# Patient Record
Sex: Male | Born: 1985 | Race: White | Hispanic: No | Marital: Single | Smoking: Never smoker
Health system: Southern US, Community
[De-identification: ages and names within clinical notes are randomized; demographics above are authoritative.]

## PROBLEM LIST (undated history)

## (undated) DIAGNOSIS — F419 Anxiety disorder, unspecified: Secondary | ICD-10-CM

## (undated) DIAGNOSIS — F329 Major depressive disorder, single episode, unspecified: Secondary | ICD-10-CM

## (undated) DIAGNOSIS — G479 Sleep disorder, unspecified: Secondary | ICD-10-CM

## (undated) DIAGNOSIS — F32A Depression, unspecified: Secondary | ICD-10-CM

## (undated) HISTORY — PX: UPPER GASTROINTESTINAL ENDOSCOPY: SHX188

---
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
## 2006-10-16 ENCOUNTER — Emergency Department (HOSPITAL_COMMUNITY): Admission: EM | Admit: 2006-10-16 | Discharge: 2006-10-16 | Payer: Self-pay | Admitting: Emergency Medicine

## 2006-11-07 ENCOUNTER — Emergency Department (HOSPITAL_COMMUNITY): Admission: EM | Admit: 2006-11-07 | Discharge: 2006-11-07 | Payer: Self-pay | Admitting: Emergency Medicine

## 2006-11-15 ENCOUNTER — Emergency Department (HOSPITAL_COMMUNITY): Admission: EM | Admit: 2006-11-15 | Discharge: 2006-11-15 | Payer: Self-pay | Admitting: Emergency Medicine
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
## 2010-02-28 ENCOUNTER — Emergency Department (HOSPITAL_COMMUNITY): Admission: EM | Admit: 2010-02-28 | Discharge: 2010-02-28 | Payer: Self-pay | Admitting: Emergency Medicine

## 2010-03-05 ENCOUNTER — Ambulatory Visit: Payer: Self-pay | Admitting: Gastroenterology

## 2010-03-05 DIAGNOSIS — R1013 Epigastric pain: Secondary | ICD-10-CM

## 2010-03-06 ENCOUNTER — Encounter: Payer: Self-pay | Admitting: Gastroenterology

## 2010-03-15 ENCOUNTER — Ambulatory Visit (HOSPITAL_COMMUNITY): Admission: RE | Admit: 2010-03-15 | Discharge: 2010-03-15 | Payer: Self-pay | Admitting: Gastroenterology

## 2010-03-27 ENCOUNTER — Telehealth (INDEPENDENT_AMBULATORY_CARE_PROVIDER_SITE_OTHER): Payer: Self-pay

## 2010-04-06 ENCOUNTER — Ambulatory Visit: Payer: Self-pay | Admitting: Gastroenterology

## 2010-07-10 NOTE — Progress Notes (Signed)
Summary: prilosec samples  Phone Note Call from Patient Call back at Home Phone [phone]   Caller: Patient Summary of Call: pt called requesting samples of prilosec  #20 given Initial call taken by: Hendricks Limes LPN,  March 27, 2010 4:13 PM

## 2010-07-10 NOTE — Letter (Signed)
Summary: EGD ORDER  EGD ORDER   Imported By: Ave Filter 03/06/2010 12:51:00  _____________________________________________________________________  External Attachment:    Type:   Image     Comment:   External Document

## 2010-07-10 NOTE — Assessment & Plan Note (Signed)
Summary: stomach burning w/pain/spitting up blood/ss   Visit Type:  Initial Consult Referring Horacio Werth:  [facility] Ivery Quale, [place]) Primary Care Elbridge Magowan:  none  Chief Complaint:  buring and pain in stomach, vomiting, and spitting up blood.  History of Present Illness: Kevin Lam is a pleasant 25 year old male who presents today at the request of Mr. Ivery Quale, [place], from the ED at [facility]. He presented to the ED on 02/28/10 with complaints of severe epigastric pain. Labs were drawn which were within normal limits. No films were done. He was discharged with pain medication and carafate, as well as instructions to continue Prilosec. He states he has had epigastric pain for the past 4-5 years; however, over the past week it has worsened in severity and intensity. He describes the pain as a "wound" as well as "burning". Reports +nausea with carafate, decreased appetite due to pain/nausea; has lost 4 lbs over the past week. Pain is intermittent, aggravated by movement, and not related to eating/drinking. Not relieved by anything. No significant change in bowel habits. Reports alcohol worsens pain as well as spicy foods. +reflux symptoms at night. Admits that he is not taking Prilosec regularly. Since ED visit, has taken a total of 3 times.   Current Problems (verified): 1)  Abdominal Pain-epigastric  (ICD-789.06)  Current Medications (verified): 1)  Carafate 1 Gm Tabs (Sucralfate) .... Two Times A Day 2)  Prilosec 20 Mg Cpdr (Omeprazole) .... Once Daily 3)  Lortab 5-500 Mg Tabs (Hydrocodone-Acetaminophen) .... As Needed  Allergies (verified): No Known Drug Allergies  Past History:  Family History: Last updated: 03/05/2010 Dad: diabetes Mom: gastritis, depression siblings: sister: anxiety, depression, heart condition No FH of Colon Cancer:  Social History: Last updated: 03/05/2010 unemployed Patient currently smokes 10 cigarettes/week Alcohol Use - yes: 5-6 beers  on weekends   Past Medical History: GERD  Past Surgical History: none  Family History: Reviewed history and no changes required. Dad: diabetes Mom: gastritis, depression siblings: sister: anxiety, depression, heart condition No FH of Colon Cancer:  Social History: Reviewed history and no changes required. unemployed Patient currently smokes 10 cigarettes/week Alcohol Use - yes: 5-6 beers on weekends  Smoking Status:  current  Review of Systems General:  Denies fever, chills, and sweats. Eyes:  Denies blurring, irritation, and vision loss. ENT:  Denies sore throat, hoarseness, and difficulty swallowing. CV:  Denies chest pains, dyspnea on exertion, and peripheral edema. Resp:  Denies dyspnea at rest and cough. GI:  Complains of indigestion/heartburn and abdominal pain; denies difficulty swallowing, pain on swallowing, constipation, change in bowel habits, and bloody BM's. GU:  Denies urinary burning and urinary frequency. MS:  Denies joint pain / LOM, joint swelling, and joint stiffness. Neuro:  Denies weakness, syncope, and headache. Psych:  Denies depression, anxiety, and memory loss.  Vital Signs:  Patient profile:   25 year old male Height:      65 inches Weight:      153 pounds BMI:     25.55 Temp:     98.4 degrees F oral Pulse rate:   80 / minute BP sitting:   124 / 88  (left arm) Cuff size:   regular  Vitals Entered By: Hendricks Limes LPN (March 05, 2010 2:11 PM)  Physical Exam  General:  Well developed, well nourished, no acute distress. Eyes:  sclera non-icteric. Mouth:  No deformity or lesions, dentition normal. Lungs:  Clear throughout to auscultation. Heart:  Regular rate and rhythm; no murmurs, rubs,  or bruits. Abdomen:  normal bowel sounds, without guarding, without rebound, and no hepatomegally or splenomegaly.  tenderness in epigastric region with palpation.  Msk:  Symmetrical with no gross deformities. Normal posture. Pulses:  Normal pulses  noted. Extremities:  No clubbing, cyanosis, edema or deformities noted. Neurologic:  Alert and  oriented x4;  grossly normal neurologically.  Impression & Recommendations:  Problem # 1:  ABDOMINAL PAIN-EPIGASTRIC (ICD-69.69) 25 year old, pleasant, Caucasian male with long-standing history of epigastric discomfort, now worsened over the past week requiring ED visit on 9/21. Not related to eating/drinking, worsened with movement, exacerbated by alcohol, spicy foods. + intermittent nausea. Typical GERD symptoms as well.  No significant trial of PPI.  Likely diagnosis GERD vs. gastritis.  Avoid alcohol/nicotine use Prilosec 20 mg samples given to Mr. Lipton for 10 day trial If not improved or worsened over next 8-10 days, will set up for EGD  Orders: Consultation Level IV (78295) Consultation Level III (62130)  Patient Instructions: 1)  Avoid alcohol and nicotine use for now 2)  Prilosec daily X10 days 3)  Call office if symptoms worsen or not alleviated over next week 4)  Discussed possible EGD in near future if symptoms progress, risks and benefits discussed, pt understands and has given verbal consent if it is deemed necessary after trial of PPI  Appended Document: stomach burning w/pain/spitting up blood/ss EGD W/ PROPOFOL ON OCT 6.  Appended Document: stomach burning w/pain/spitting up blood/ss Pt scheduled for 03/15/10.

## 2010-08-22 LAB — BASIC METABOLIC PANEL
BUN: 5 mg/dL — ABNORMAL LOW (ref 6–23)
Creatinine, Ser: 0.86 mg/dL (ref 0.4–1.5)
GFR calc non Af Amer: >60 mL/min (ref >60)
Glucose, Bld: 121 mg/dL — ABNORMAL HIGH (ref 70–99)
Potassium: 3.9 mEq/L (ref 3.5–5.1)

## 2010-08-23 LAB — CBC
HCT: 47.8 % (ref 39.0–52.0)
MCHC: 34 g/dL (ref 30.0–36.0)
Platelets: 209 10*3/uL (ref 150–400)
RDW: 12.8 % (ref 11.5–15.5)
WBC: 9.9 10*3/uL (ref 4.0–10.5)

## 2010-08-23 LAB — DIFFERENTIAL
Basophils Absolute: 0 10*3/uL (ref 0.0–0.1)
Basophils Relative: 1 % (ref 0–1)
Eosinophils Relative: 2 % (ref 0–5)
Lymphocytes Relative: 22 % (ref 12–46)
Monocytes Absolute: 0.9 10*3/uL (ref 0.1–1.0)
Neutro Abs: 6.6 10*3/uL (ref 1.7–7.7)

## 2010-08-23 LAB — HEPATIC FUNCTION PANEL
Bilirubin, Direct: 0.1 mg/dL (ref 0.0–0.3)
Indirect Bilirubin: 0.7 mg/dL (ref 0.3–0.9)
Total Protein: 7.8 g/dL (ref 6.0–8.3)

## 2010-10-26 NOTE — Discharge Summary (Signed)
NAMEOLEGARIO, EMBERSON NO.:  [id number]   MEDICAL RECORD NO.:  [id number]                   PATIENT TYPE:  IPS   LOCATION:  0202                                 FACILITY:  [facility]   PHYSICIAN:  Cindie Crumbly, M.D.               DATE OF BIRTH:  Oct 13, 1985   DATE OF ADMISSION:  03/02/2002  DATE OF DISCHARGE:  03/08/2002                                 DISCHARGE SUMMARY   REASON FOR ADMISSION:  This 25 year old white male was admitted complaining  of depression status post overdose of Valium, hydrocodone and Ambien as a  suicide attempt.  For further history of present illness, please see the  patient's psychiatric admission assessment.   PHYSICAL EXAMINATION:  At the time of admission was entirely unremarkable.   LABORATORY DATA:  The patient underwent a laboratory workup to rule out any  medical problems contributing to his symptomatology.  Urine probe for  gonorrhea and chlamydia were negative.  RPR was nonreactive.  UA was  unremarkable.  CBC showed eosinophil count of 7% and was otherwise  unremarkable.  Basic metabolic panel showed CO2 of 34 and was otherwise  unremarkable.  Urine drug screen was positive for metabolites of  benzodiazepines, opiates and tetrahydrocannabinol.  Blood alcohol level was  negative.  Acetaminophen and salicylate levels were negative.  The patient  received no x-rays, no special procedures, no additional consultations.  He  sustained no complications during the course of this hospitalization.   HOSPITAL COURSE:  On admission, the patient was psychomotor retarded.  Concentration and attention span were decreased.  He was easily distracted  by extraneous stimuli.  He displayed poor impulse control.  His affect and  mood were depressed, irritable and anxious.  He has remained in denial of  his chemical dependency issues.  He was continued on a trial of Celexa and  titrated up to a therapeutic dose.  To this was added  Wellbutrin SR.  He  tolerated these medications well without side effects and, at the time of  discharge, he denies any suicidal or homicidal ideation.  His affect and  mood have improved.  He remains in denial of his chemical dependency  problems and no longer appears to be a danger to himself or others and  consequently is felt to have reached his maximum benefits of hospitalization  and is ready for discharge to a less restrictive alternative setting.   CONDITION ON DISCHARGE:  Improved.   DIAGNOSES (ACCORDING TO DSM-IV):   AXIS I:  1. Major depression, recurrent, severe without psychosis.  2. Rule out obsessive-compulsive disorder.  3. Polysubstance dependence.   AXIS II:  1. Rule out learning disorder not otherwise specified.  2. Rule out personality disorder not otherwise specified.   AXIS III:  Irritable bowel syndrome.   AXIS IV:  Severe.   AXIS V:  20 on admission; 30 on discharge.   FURTHER EVALUATION  AND TREATMENT RECOMMENDATIONS:  1. The patient is discharged to home.  2. He is discharged on an unrestricted level of activity and a regular diet.  3. He is discharged on Wellbutrin XL 300 mg p.o. q.d., Celexa 60 mg p.o.     q.d.  4. He will follow up with his outpatient psychiatrist and psychotherapist     for all further aspects of his psychiatric care and his primary care     physician for all further aspects of his medical care.  Consequently, I     will sign off on the case at this time.                                               Cindie Crumbly, M.D.    TS/MEDQ  D:  03/08/2002  T:  03/08/2002  Job:  [id number]

## 2010-10-26 NOTE — H&P (Signed)
NAMEJANARI, YAMADA NO.:  [id number]   MEDICAL RECORD NO.:  [id number]                   PATIENT TYPE:  IPS   LOCATION:  0202                                 [facility]ACILITY:  [facility]   PHYSICIAN:  Cindie Crumbly, M.D.               DATE OF BIRTH:  08/04/1985   DATE OF ADMISSION:  03/02/2002  DATE OF DISCHARGE:                         PSYCHIATRIC ADMISSION ASSESSMENT   REASON FOR ADMISSION:  This 25 year old white male was admitted complaining  of depression status post overdose on Valium, hydrocodone and Ambien as a  suicide attempt.   HISTORY OF PRESENT ILLNESS:  The patient complains of a depressed, irritable  and angry mood most of the day nearly every day, giving up on activities  previously found pleasurable, feelings of hopelessness, helplessness,  worthlessness, decreased concentration and energy level, increased symptoms  of fatigue, insomnia, decreased appetite, recurrent thoughts of death.  He  states that he will get more pills and overdose again and is refusing to  contract for safety.  He reports several current stressors including his  brother and sister being in the [employer] and being sent overseas.  His  sister was sent to [place] last week.  The patient has been failing in school  grades.  He has been transferred into a new school and he reports that his  grades have continued to decline.   PAST PSYCHIATRIC HISTORY:  Significant for the patient having been on Celexa  at 40 mg p.o. q.d. for most of the past year.  He reports that he initially  did well on this medication but the effect of the medication appears to be  wearing off.  Initially, he had a decrease in obsessive-compulsive  symptomatology, but now he finds himself going back to previous behaviors  including counting multiple times up to the number six.  He reports that he  has no idea why he counts to six.  He knows that it makes no sense to do so  but he is unable to break the  habit of doing so, and this has been  interfering with both school and social interactions with peers and his  parents and teachers.  He reports 3 previous suicide attempts by drug  overdose.  He has been followed in outpatient psychotherapy and medication  management for the past 9 months, but again reports that his medication  didn't work for the last several months.  He reports that he has been  depressed for the past year and a half with recurrent episodes.   DRUG AND ALCOHOL ABUSE HISTORY:  Significant for the patient using cannabis  daily 1 year ago.  He states that his last use was 4 days ago and that he  now uses it intermittently and would like to quit using it as he feels that  it has not been helpful to him.  He smokes approximately 1/2 pack of  cigarettes per day  and has done so for the past 2 years.  He reports that he  has tried alcohol in the past but denies any continued use of alcohol.  With  regard to street drugs, he states that the drugs that he overdosed on he  obtained from a friend.  The friend was apparently called to the office at  school and handed the patient these drugs for fear that the friend might  have to undergo a search while in the principal's office.  The patient then  took the medications home with him that night and took all of them that were  available to him as an attempt to kill himself.   PAST MEDICAL HISTORY:  Significant for a history of irritable bowel  syndrome.  He has no known drug allergies or sensitivities.  He denies any  other medical or surgical problems.   CURRENT MEDICATIONS:  Celexa 40 mg p.o. q.d.   STRENGTHS AND ASSETS:  His mother and father are supportive of him.   FAMILY AND SOCIAL HISTORY:  The patient lives with his mother and father.  Mother has a history of major depression.  The patient is currently  repeating the 9th grade, having failed this past year.   MENTAL STATUS EXAM:  The patient presents as a well-developed,  well-  nourished adolescent white male who is alert, oriented x4, psychomotor  retarded, and whose appearance is compatible with his stated age.  His  speech is coherent with a decreased rate and volume of speech, increased  speech latency.  He displays no looseness of associations, phonemic errors  or evidence of a thought disorder.  His concentration and attention span are  decreased.  He is easily distracted by extraneous stimuli.  He displays poor  impulse control.  His affect and mood are depressed, irritable and anxious.  His immediate recall, short term memory and remote memory are intact.  Similarities and differences are within normal limits.  He is able to  abstract the proverbs.  His thought process are generally goal directed.   ADMISSION DIAGNOSES:   AXIS I:  1. Major depression, recurrent, severe, without psychosis.  2. Obsessive-compulsive disorder.  3. Polysubstance abuse.  4. Rule out polysubstance dependence.   AXIS II:  Rule out learning disorder not otherwise specified.   AXIS III:  Irritable bowel syndrome.   AXIS IV:  Severe.   AXIS V:  Code 20.   FURTHER EVALUATION AND TREATMENT RECOMMENDATIONS:  1. Estimated length of stay for the patient on the inpatient unit is 5 to 7     days.  2. Initial discharge plan is to discharge the patient to home.  3. Initial plan of care is to increase Celexa to 60 mg p.o. q.a.m.  To this     regimen will be added Wellbutrin SR which will be titrated up to a     therapeutic dose.  The patient reports that even while taking Celexa and     having been able to be in remission for most of his depressive symptoms,     he reports that Celexa was never able to return him back to his baseline     where he had increased concentration and able to do his school work.     Thus he appeared to have an inadequate response to Celexa even at its     best and relapsed.  It appears that the patient at this point in time    needs a  norepinephrine  reuptake inhibitor in addition to a serotonin     reuptake inhibitor, thus the need for this combination of medications.     The patient will also be given a NicoDerm patch for nicotine withdrawal.     He will undergo a laboratory workup to rule out any other medical     problems contributing to his symptomatology.  Psychotherapy will focus on     addressing the patient's chemical dependency problems, decreasing     cognitive distortions and potential for self harm.                                                 Cindie Crumbly, M.D.    TS/MEDQ  D:  03/03/2002  T:  03/04/2002  Job:  [id number]

## 2010-12-21 ENCOUNTER — Emergency Department (HOSPITAL_COMMUNITY)
Admission: EM | Admit: 2010-12-21 | Discharge: 2010-12-22 | Disposition: A | Discharge status: Home / Self Care | Payer: 59 | Attending: Emergency Medicine | Admitting: Emergency Medicine

## 2010-12-21 ENCOUNTER — Emergency Department (HOSPITAL_COMMUNITY)
Admission: EM | Admit: 2010-12-21 | Discharge: 2010-12-21 | Disposition: A | Discharge status: Home / Self Care | Payer: 59 | Source: Home / Self Care | Attending: Emergency Medicine | Admitting: Emergency Medicine

## 2010-12-21 ENCOUNTER — Encounter: Payer: Self-pay | Admitting: *Deleted

## 2010-12-21 DIAGNOSIS — J36 Peritonsillar abscess: Secondary | ICD-10-CM | POA: Insufficient documentation

## 2010-12-21 DIAGNOSIS — R509 Fever, unspecified: Secondary | ICD-10-CM | POA: Insufficient documentation

## 2010-12-21 DIAGNOSIS — J039 Acute tonsillitis, unspecified: Secondary | ICD-10-CM | POA: Insufficient documentation

## 2010-12-21 HISTORY — DX: Major depressive disorder, single episode, unspecified: F32.9

## 2010-12-21 HISTORY — DX: Anxiety disorder, unspecified: F41.9

## 2010-12-21 HISTORY — DX: Sleep disorder, unspecified: G47.9

## 2010-12-21 HISTORY — DX: Depression, unspecified: F32.A

## 2010-12-21 LAB — RAPID STREP SCREEN (MED CTR MEBANE ONLY): Streptococcus, Group A Screen (Direct): POSITIVE — AB

## 2010-12-21 MED ORDER — HYDROMORPHONE HCL 1 MG/ML IJ SOLN
1.0000 mg | Freq: Once | INTRAMUSCULAR | Status: AC
  Administered 2010-12-21: 1 mg via INTRAVENOUS
  Filled 2010-12-21: qty 1

## 2010-12-21 MED ORDER — SODIUM CHLORIDE 0.9 % IV SOLN
Freq: Once | INTRAVENOUS | Status: AC
  Administered 2010-12-21: 21:00:00 via INTRAVENOUS

## 2010-12-21 MED ORDER — PIPERACILLIN-TAZOBACTAM 3.375 G IVPB
3.3750 g | Freq: Once | INTRAVENOUS | Status: DC
  Administered 2010-12-21: 3.375 g via INTRAVENOUS
  Filled 2010-12-21: qty 50

## 2010-12-21 MED ORDER — ONDANSETRON HCL 4 MG/2ML IJ SOLN
4.0000 mg | Freq: Once | INTRAMUSCULAR | Status: AC
  Administered 2010-12-21: 4 mg via INTRAVENOUS
  Filled 2010-12-21: qty 2

## 2010-12-21 NOTE — ED Notes (Signed)
Pt left the er to go by pov to [place] for eent follow up, pt has a driver

## 2010-12-21 NOTE — ED Provider Notes (Addendum)
History     Chief Complaint  Patient presents with  . Dysphagia   Patient is a 25 y.o. male presenting with pharyngitis. The history is provided by the patient. No language interpreter was used.  Sore Throat This is a new problem. Episode onset: 2 days ago. The problem has been gradually worsening. Associated symptoms include neck pain and a sore throat. Pertinent negatives include no fever, nausea or swollen glands.  Sore Throat This is a new problem. Episode onset: 2 days ago. The problem has been gradually worsening.    Past Medical History  Diagnosis Date  . Depression   . Anxiety   . Sleep disorder     Past Surgical History  Procedure Date  . Upper gastrointestinal endoscopy     History reviewed. No pertinent family history.  History  Substance Use Topics  . Smoking status: Not on file  . Smokeless tobacco: Not on file  . Alcohol Use: Yes     occasionally      Review of Systems  Constitutional: Negative for fever.  HENT: Positive for sore throat, trouble swallowing, neck pain and voice change.   Gastrointestinal: Negative for nausea.  pt seen yest by his PCP at [facility] initial dose of zithromax yest and 2nd dose today.  Given an injection yest but doesn't know what the medication was.  He is here in the ED today b/c of worsening pain and extreme difficulty swallowing.  Physical Exam  BP 144/93  Pulse 113  Temp(Src) 100.8 F (38.2 C) (Oral)  Resp 20  Ht 5\' 6"  (1.676 m)  Wt 150 lb (68.04 kg)  BMI 24.21 kg/m2  SpO2 99%  Physical Exam  Nursing note and vitals reviewed. Constitutional: He is oriented to person, place, and time. Vital signs are normal. He appears well-developed and well-nourished.  HENT:  Head: Normocephalic and atraumatic.  Right Ear: External ear normal.  Left Ear: External ear normal.  Nose: Nose normal.  Mouth/Throat: No oropharyngeal exudate.         L soft palate swelling with uvular deviation to the R.  "hot  potato voice".  Will not swallow saliva.  Eyes: Conjunctivae and EOM are normal. Pupils are equal, round, and reactive to light. Right eye exhibits no discharge. Left eye exhibits no discharge. No scleral icterus.  Neck: Normal range of motion. Neck supple. No JVD present. No tracheal deviation present. No thyromegaly present.  Cardiovascular: Normal rate, regular rhythm, normal heart sounds, intact distal pulses and normal pulses.  Exam reveals no gallop and no friction rub.   No murmur heard. Pulmonary/Chest: Effort normal and breath sounds normal. No stridor. No respiratory distress. He has no wheezes. He has no rales. He exhibits no tenderness.  Abdominal: Soft. Normal appearance and bowel sounds are normal. He exhibits no distension and no mass. There is no tenderness. There is no rebound and no guarding.  Musculoskeletal: Normal range of motion. He exhibits no edema and no tenderness.  Lymphadenopathy:    He has no cervical adenopathy.  Neurological: He is alert and oriented to person, place, and time. He has normal reflexes. No cranial nerve deficit. Coordination normal. GCS eye subscore is 4. GCS verbal subscore is 5. GCS motor subscore is 6.  Reflex Scores:      Tricep reflexes are 2+ on the right side and 2+ on the left side.      Bicep reflexes are 2+ on the right side and 2+ on the left side.  Brachioradialis reflexes are 2+ on the right side and 2+ on the left side.      Patellar reflexes are 2+ on the right side and 2+ on the left side.      Achilles reflexes are 2+ on the right side and 2+ on the left side. Skin: Skin is warm and dry. No rash noted. He is not diaphoretic.  Psychiatric: He has a normal mood and affect. His speech is normal and behavior is normal. Judgment and thought content normal. Cognition and memory are normal.    ED Course  Procedures  MDM i spoke with dr. Annalee Genta.  He requests that the pt [facility] and he will treat him  there.      Worthy Rancher, PA  Medical screening examination/treatment/procedure(s) were performed by non-physician practitioner and as supervising physician I was immediately available for consultation/collaboration.  12/21/10 2111  Benny Lennert, MD 01/25/11 1357

## 2010-12-21 NOTE — ED Notes (Signed)
Pt c/o difficulty swallowing x 2 days; pt states he went to his PCP yesterday and was given a steroid shot but has had no relief and he feels like the swelling is worse

## 2010-12-21 NOTE — ED Notes (Signed)
Call [facility] for ENT on call will call back to [phone].

## 2011-03-28 LAB — DIFFERENTIAL
Basophils Relative: 0
Lymphocytes Relative: 13
Lymphs Abs: 1.5
Monocytes Relative: 5
Neutro Abs: 9.4 — ABNORMAL HIGH
Neutrophils Relative %: 81 — ABNORMAL HIGH

## 2011-03-28 LAB — BASIC METABOLIC PANEL
Calcium: 9
Creatinine, Ser: 0.96
GFR calc Af Amer: >60
GFR calc non Af Amer: >60
Sodium: 134 — ABNORMAL LOW

## 2011-03-28 LAB — CBC
Hemoglobin: 15.3
RBC: 4.94
WBC: 11.7 — ABNORMAL HIGH

## 2013-09-04 ENCOUNTER — Emergency Department (HOSPITAL_COMMUNITY): Payer: BC Managed Care – PPO

## 2013-09-04 ENCOUNTER — Emergency Department (HOSPITAL_COMMUNITY)
Admission: EM | Admit: 2013-09-04 | Discharge: 2013-09-04 | Disposition: A | Discharge status: Home / Self Care | Payer: BC Managed Care – PPO | Attending: Emergency Medicine | Admitting: Emergency Medicine

## 2013-09-04 ENCOUNTER — Encounter (HOSPITAL_COMMUNITY): Payer: Self-pay | Admitting: Emergency Medicine

## 2013-09-04 DIAGNOSIS — S46909A Unspecified injury of unspecified muscle, fascia and tendon at shoulder and upper arm level, unspecified arm, initial encounter: Secondary | ICD-10-CM | POA: Insufficient documentation

## 2013-09-04 DIAGNOSIS — W08XXXA Fall from other furniture, initial encounter: Secondary | ICD-10-CM | POA: Insufficient documentation

## 2013-09-04 DIAGNOSIS — Z79899 Other long term (current) drug therapy: Secondary | ICD-10-CM | POA: Insufficient documentation

## 2013-09-04 DIAGNOSIS — Z8659 Personal history of other mental and behavioral disorders: Secondary | ICD-10-CM | POA: Insufficient documentation

## 2013-09-04 DIAGNOSIS — Y929 Unspecified place or not applicable: Secondary | ICD-10-CM | POA: Insufficient documentation

## 2013-09-04 DIAGNOSIS — Y939 Activity, unspecified: Secondary | ICD-10-CM | POA: Insufficient documentation

## 2013-09-04 DIAGNOSIS — S4980XA Other specified injuries of shoulder and upper arm, unspecified arm, initial encounter: Secondary | ICD-10-CM | POA: Insufficient documentation

## 2013-09-04 DIAGNOSIS — M25512 Pain in left shoulder: Secondary | ICD-10-CM

## 2013-09-04 MED ORDER — OXYCODONE-ACETAMINOPHEN 5-325 MG PO TABS: 1.0000 | ORAL_TABLET | ORAL | Status: DC | PRN

## 2013-09-04 MED ORDER — IBUPROFEN 800 MG PO TABS: 800.0000 mg | ORAL_TABLET | Freq: Three times a day (TID) | ORAL | Status: DC

## 2013-09-04 MED ORDER — OXYCODONE-ACETAMINOPHEN 5-325 MG PO TABS
2.0000 | ORAL_TABLET | Freq: Once | ORAL | Status: AC
  Administered 2013-09-04: 2 via ORAL
  Filled 2013-09-04: qty 2

## 2013-09-04 NOTE — ED Notes (Signed)
T. Triplett, PA at bedside. 

## 2013-09-04 NOTE — ED Notes (Signed)
Pt unable to use left arm from fall last night, assisted pt with gown placement, abrasion noted to left temple as well, pt denies LOC/N/V or blurry vision
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
## 2013-09-04 NOTE — Discharge Instructions (Signed)
   Shoulder Pain The shoulder is the joint that connects your arms to your body. The bones that form the shoulder joint include the upper arm bone (humerus), the shoulder blade (scapula), and the collarbone (clavicle). The top of the humerus is shaped like a ball and fits into a rather flat socket on the scapula (glenoid cavity). A combination of muscles and strong, fibrous tissues that connect muscles to bones (tendons) support your shoulder joint and hold the ball in the socket. Small, fluid-filled sacs (bursae) are located in different areas of the joint. They act as cushions between the bones and the overlying soft tissues and help reduce friction between the gliding tendons and the bone as you move your arm. Your shoulder joint allows a wide range of motion in your arm. This range of motion allows you to do things like scratch your back or throw a ball. However, this range of motion also makes your shoulder more prone to pain from overuse and injury. Causes of shoulder pain can originate from both injury and overuse and usually can be grouped in the following four categories:  Redness, swelling, and pain (inflammation) of the tendon (tendinitis) or the bursae (bursitis).  Instability, such as a dislocation of the joint.  Inflammation of the joint (arthritis).  Broken bone (fracture). HOME CARE INSTRUCTIONS   Apply ice to the sore area.  Put ice in a plastic bag.  Place a towel between your skin and the bag.  Leave the ice on for 15-20 minutes, 03-04 times per day for the first 2 days.  Stop using cold packs if they do not help with the pain.  If you have a shoulder sling or immobilizer, wear it as long as your caregiver instructs. Only remove it to shower or bathe. Move your arm as little as possible, but keep your hand moving to prevent swelling.  Squeeze a soft ball or foam pad as much as possible to help prevent swelling.  Only take over-the-counter or prescription medicines for  pain, discomfort, or fever as directed by your caregiver. SEEK MEDICAL CARE IF:   Your shoulder pain increases, or new pain develops in your arm, hand, or fingers.  Your hand or fingers become cold and numb.  Your pain is not relieved with medicines. SEEK IMMEDIATE MEDICAL CARE IF:   Your arm, hand, or fingers are numb or tingling.  Your arm, hand, or fingers are significantly swollen or turn white or blue. MAKE SURE YOU:   Understand these instructions.  Will watch your condition.  Will get help right away if you are not doing well or get worse. Document Released: 03/06/2005 Document Revised: 02/19/2012 Document Reviewed: 05/11/2011 [employer] Patient Information 2014 [employer].  

## 2013-09-04 NOTE — ED Notes (Signed)
Pt verbalized understanding of no driving within 4 hours of taking percocet due to med may causes drowsiness, pt also made aware that med causes constipation

## 2013-09-06 NOTE — ED Provider Notes (Signed)
CSN: [id number]     Arrival date & time 09/04/13  1243 History   First MD Initiated Contact with Patient 09/04/13 1258     Chief Complaint  Patient presents with  . Shoulder Pain     (Consider location/radiation/quality/duration/timing/severity/associated sxs/prior Treatment) Patient is a 28 y.o. male presenting with shoulder pain. The history is provided by the patient.  Shoulder Pain This is a new problem. The current episode started yesterday. The problem occurs constantly. The problem has been unchanged. Associated symptoms include arthralgias. Pertinent negatives include no abdominal pain, chest pain, chills, fever, headaches, joint swelling, nausea, neck pain, numbness, rash, sore throat, vertigo, visual change, vomiting or weakness. Exacerbated by: left arm movement. He has tried NSAIDs for the symptoms. The treatment provided no relief.   Patient reports left shoulder pain and difficulty moving the left arm since a fall in which he landed directly onto the left shoulder.  He states he fell approximately 4 feet off a wooden deck on the evening prior to ED arrival.  He reports pain with attempted movement of the left shoulder.  Pain resolves when the left arm is held to his body.  He denies neck pain, headaches, dizziness, chest pain, or LOC.    Past Medical History  Diagnosis Date  . Depression   . Anxiety   . Sleep disorder    Past Surgical History  Procedure Laterality Date  . Upper gastrointestinal endoscopy     No family history on file. History  Substance Use Topics  . Smoking status: Never Smoker   . Smokeless tobacco: Not on file  . Alcohol Use: Yes     Comment: occasionally    Review of Systems  Constitutional: Negative for fever and chills.  HENT: Negative for sore throat.   Cardiovascular: Negative for chest pain.  Gastrointestinal: Negative for nausea, vomiting and abdominal pain.  Genitourinary: Negative for dysuria and difficulty urinating.   Musculoskeletal: Positive for arthralgias. Negative for back pain, gait problem, joint swelling and neck pain.  Skin: Negative for color change, rash and wound.  Neurological: Negative for vertigo, weakness, numbness and headaches.  All other systems reviewed and are negative.      Allergies  Review of patient's allergies indicates no known allergies.  Home Medications   Current Outpatient Rx  Name  Route  Sig  Dispense  Refill  . ibuprofen (ADVIL) 200 MG tablet   Oral   Take 600 mg by mouth every 6 (six) hours as needed for moderate pain.         Marland Kitchen ibuprofen (ADVIL,MOTRIN) 800 MG tablet   Oral   Take 1 tablet (800 mg total) by mouth 3 (three) times daily.   21 tablet   0   . oxyCODONE-acetaminophen (PERCOCET/ROXICET) 5-325 MG per tablet   Oral   Take 1 tablet by mouth every 4 (four) hours as needed for severe pain.   20 tablet   0    BP 126/83  Pulse 102  Temp(Src) 98 F (36.7 C) (Oral)  Resp 12  Ht 5\' 6"  (1.676 m)  Wt 170 lb (77.111 kg)  BMI 27.45 kg/m2  SpO2 95% Physical Exam  Nursing note and vitals reviewed. Constitutional: He is oriented to person, place, and time. He appears well-developed and well-nourished. No distress.  HENT:  Head: Normocephalic and atraumatic.  Neck: Normal range of motion. Neck supple. No thyromegaly present.  Cardiovascular: Normal rate, regular rhythm, normal heart sounds and intact distal pulses.   No murmur heard.  Pulmonary/Chest: Effort normal and breath sounds normal. No respiratory distress. He exhibits no tenderness.  Musculoskeletal: He exhibits tenderness. He exhibits no edema.  ttp of the left anterior shoulder.  Pain with abduction of the left arm and rotation of the shoulder.  Radial pulse is brisk, distal sensation intact, CR< 2 sec. Grip strength is strong and symmetrical.   No abrasions, edema , erythema or step-off deformity of the joint. Left elbow and wrist are NT  Lymphadenopathy:    He has no cervical  adenopathy.  Neurological: He is alert and oriented to person, place, and time. He has normal strength. No sensory deficit. He exhibits normal muscle tone. Coordination normal.  Skin: Skin is warm and dry.    ED Course  Procedures (including critical care time) Labs Review Labs Reviewed - No data to display Imaging Review Dg Shoulder Left  09/04/2013   CLINICAL DATA:  Shoulder pain.  Fall.  EXAM: LEFT SHOULDER - 2+ VIEW  COMPARISON:  None.  FINDINGS: There is no evidence of fracture or dislocation. There is no evidence of arthropathy or other focal bone abnormality. Soft tissues are unremarkable.  IMPRESSION: Negative.   Electronically Signed   By: Charlett NoseKevin  Dover M.D.   On: 09/04/2013 13:20     EKG Interpretation None      MDM   Final diagnoses:  Shoulder pain, left    Sling applied.  XR results reviewed and discussed with patient.  Exam is concerning for possible rotator cuff injury vs labral injury.  Patient agrees to symptomatic treatment , ice and close f/u with orthopedics.  Given referral info.    Pt is well appearing, VSS.  Appears stable for d/c    Avalee Castrellon L. Trisha Mangleriplett, PA-C 09/06/13 1736

## 2013-09-09 NOTE — ED Provider Notes (Signed)
Medical screening examination/treatment/procedure(s) were performed by non-physician practitioner and as supervising physician I was immediately available for consultation/collaboration.   EKG Interpretation None       Ranetta Armacost, MD 09/09/13 0639 

## 2019-06-23 ENCOUNTER — Ambulatory Visit: Payer: 59 | Attending: Internal Medicine

## 2019-11-09 ENCOUNTER — Telehealth: Payer: Self-pay | Admitting: Orthopaedic Surgery

## 2019-11-09 NOTE — Telephone Encounter (Signed)
Patient called per voice message this afternoon; call returned - unable to leave message as voice messages are full.

## 2023-10-14 ENCOUNTER — Emergency Department (HOSPITAL_COMMUNITY)
Admission: EM | Admit: 2023-10-14 | Discharge: 2023-10-14 | Disposition: A | Discharge status: Home / Self Care | Attending: Emergency Medicine | Admitting: Emergency Medicine

## 2023-10-14 ENCOUNTER — Encounter (HOSPITAL_COMMUNITY): Payer: Self-pay

## 2023-10-14 ENCOUNTER — Emergency Department (HOSPITAL_COMMUNITY)

## 2023-10-14 ENCOUNTER — Other Ambulatory Visit: Payer: Self-pay

## 2023-10-14 DIAGNOSIS — R002 Palpitations: Secondary | ICD-10-CM | POA: Insufficient documentation

## 2023-10-14 DIAGNOSIS — R0602 Shortness of breath: Secondary | ICD-10-CM | POA: Insufficient documentation

## 2023-10-14 LAB — CBC WITH DIFFERENTIAL/PLATELET
Abs Immature Granulocytes: 0.05 10*3/uL (ref 0.00–0.07)
Basophils Absolute: 0.1 10*3/uL (ref 0.0–0.1)
Basophils Relative: 1 %
Eosinophils Absolute: 0.3 10*3/uL (ref 0.0–0.5)
Eosinophils Relative: 4 %
HCT: 45.1 % (ref 39.0–52.0)
Hemoglobin: 15.5 g/dL (ref 13.0–17.0)
Immature Granulocytes: 1 %
Lymphocytes Relative: 18 %
Lymphs Abs: 1.6 10*3/uL (ref 0.7–4.0)
MCH: 30.6 pg (ref 26.0–34.0)
MCHC: 34.4 g/dL (ref 30.0–36.0)
MCV: 89.1 fL (ref 80.0–100.0)
Monocytes Absolute: 0.7 10*3/uL (ref 0.1–1.0)
Monocytes Relative: 8 %
Neutro Abs: 6.1 10*3/uL (ref 1.7–7.7)
Neutrophils Relative %: 68 %
Platelets: 214 10*3/uL (ref 150–400)
RBC: 5.06 MIL/uL (ref 4.22–5.81)
RDW: 13.4 % (ref 11.5–15.5)
WBC: 8.9 10*3/uL (ref 4.0–10.5)
nRBC: 0 % (ref 0.0–0.2)

## 2023-10-14 LAB — TROPONIN I (HIGH SENSITIVITY): Troponin I (High Sensitivity): 3 ng/L (ref <18)

## 2023-10-14 LAB — MAGNESIUM: Magnesium: 2.2 mg/dL (ref 1.7–2.4)

## 2023-10-14 LAB — COMPREHENSIVE METABOLIC PANEL WITH GFR
ALT: 42 U/L (ref 0–44)
AST: 31 U/L (ref 15–41)
Albumin: 4.1 g/dL (ref 3.5–5.0)
Alkaline Phosphatase: 54 U/L (ref 38–126)
Anion gap: 9 (ref 5–15)
BUN: 10 mg/dL (ref 6–20)
CO2: 22 mmol/L (ref 22–32)
Calcium: 9.1 mg/dL (ref 8.9–10.3)
Chloride: 103 mmol/L (ref 98–111)
Creatinine, Ser: 0.79 mg/dL (ref 0.61–1.24)
GFR, Estimated: >60 mL/min (ref >60)
Glucose, Bld: 112 mg/dL — ABNORMAL HIGH (ref 70–99)
Potassium: 3.8 mmol/L (ref 3.5–5.1)
Sodium: 134 mmol/L — ABNORMAL LOW (ref 135–145)
Total Bilirubin: 0.9 mg/dL (ref 0.0–1.2)
Total Protein: 7.4 g/dL (ref 6.5–8.1)

## 2023-10-14 LAB — ETHANOL: Alcohol, Ethyl (B): <15 mg/dL (ref <15)

## 2023-10-14 NOTE — Discharge Instructions (Signed)
 Your test results today are all reassuring.  Your heart rate and rhythm were normal while you were in the emergency department.  I suspect that your episodes are related to alcohol use from the day prior.  If possible, cut back on daily alcohol use to minimize symptoms.  A cardiology referral has been ordered.  You should hear from their office in the next few days.  If you do not, call the telephone number below to set up a follow-up appointment.  Return to the emergency department for any new or worsening symptoms of concern.

## 2023-10-14 NOTE — ED Provider Notes (Signed)
 [facility] AT [facility] Provider Note   CSN: [id number] Arrival date & time: 10/14/23  1519     History  Chief Complaint  Patient presents with   Tachycardia    TARI JOINTER is a 38 y.o. male.  HPI Patient presents for palpitations.  Medical history includes anxiety, depression.  He is a daily drinker.  Daily consumption of alcohol is 3 shots of liquor and 8-10 beers.  He did drink extra yesterday due to sink at a mile holiday.  This morning, he felt to be in his normal state of health.  At around 2 PM, he had an episode of palpitations and shortness of breath.  These episodes have been recurrent since that time.  He states that he has had similar episodes before but today has been more severe and more frequent.  He went to urgent care and was sent to the ED for further evaluation.  Currently, he denies any symptoms.  He states that he has been getting intermittent episodes of palpitations while in the ED waiting room.     Home Medications Prior to Admission medications   Medication Sig Start Date End Date Taking? Authorizing Provider  ibuprofen  (ADVIL ) 200 MG tablet Take 600 mg by mouth every 6 (six) hours as needed for moderate pain.    [provider]  ibuprofen  (ADVIL ,MOTRIN ) 800 MG tablet Take 1 tablet (800 mg total) by mouth 3 (three) times daily. 09/04/13   Triplett, Tammy, PA-C  oxyCODONE -acetaminophen  (PERCOCET/ROXICET) 5-325 MG per tablet Take 1 tablet by mouth every 4 (four) hours as needed for severe pain. 09/04/13   Triplett, Benn Brash, PA-C      Allergies    Patient has no known allergies.    Review of Systems   Review of Systems  Respiratory:  Positive for shortness of breath.   Cardiovascular:  Positive for palpitations.  Psychiatric/Behavioral:  The patient is nervous/anxious.   All other systems reviewed and are negative.   Physical Exam Updated Vital Signs BP (!) 155/112   Pulse (!) 101   Temp 98.1 F (36.7 C)  (Oral)   Resp 18   Ht 5\' 6"  (1.676 m)   Wt 77.1 kg   SpO2 97%   BMI 27.43 kg/m  Physical Exam Vitals and nursing note reviewed.  Constitutional:      General: He is not in acute distress.    Appearance: Normal appearance. He is well-developed. He is not ill-appearing, toxic-appearing or diaphoretic.  HENT:     Head: Normocephalic and atraumatic.     Right Ear: External ear normal.     Left Ear: External ear normal.     Nose: Nose normal.     Mouth/Throat:     Mouth: Mucous membranes are moist.  Eyes:     Extraocular Movements: Extraocular movements intact.     Conjunctiva/sclera: Conjunctivae normal.  Cardiovascular:     Rate and Rhythm: Normal rate and regular rhythm.  Pulmonary:     Effort: Pulmonary effort is normal. No respiratory distress.  Abdominal:     General: There is no distension.     Palpations: Abdomen is soft.     Tenderness: There is no abdominal tenderness.  Musculoskeletal:        General: No swelling.     Cervical back: Normal range of motion and neck supple.  Skin:    General: Skin is warm and dry.     Coloration: Skin is not jaundiced or pale.  Neurological:  General: No focal deficit present.     Mental Status: He is alert and oriented to person, place, and time.  Psychiatric:        Mood and Affect: Mood normal.        Behavior: Behavior normal.     ED Results / Procedures / Treatments   Labs (all labs ordered are listed, but only abnormal results are displayed) Labs Reviewed  COMPREHENSIVE METABOLIC PANEL WITH GFR - Abnormal; Notable for the following components:      Result Value   Sodium 134 (*)    Glucose, Bld 112 (*)    All other components within normal limits  CBC WITH DIFFERENTIAL/PLATELET  ETHANOL  MAGNESIUM  TROPONIN I (HIGH SENSITIVITY)  TROPONIN I (HIGH SENSITIVITY)    EKG EKG Interpretation Date/Time:  Tuesday Oct 14 2023 15:43:19 EDT Ventricular Rate:  103 PR Interval:  122 QRS Duration:  82 QT  Interval:  342 QTC Calculation: 448 R Axis:   42  Text Interpretation: Sinus tachycardia Confirmed by Iva Mariner ([id number]) on 10/14/2023 5:37:15 PM  Radiology DG Chest 2 View Result Date: 10/14/2023 CLINICAL DATA:  chest pain, SOB EXAM: CHEST - 2 VIEW COMPARISON:  None available. FINDINGS: No focal airspace consolidation, pleural effusion, or pneumothorax. No cardiomegaly. No acute fracture or destructive lesion. IMPRESSION: No acute cardiopulmonary abnormality. Electronically Signed   By: Rance Burrows M.D.   On: 10/14/2023 16:01    Procedures Procedures    Medications Ordered in ED Medications - No data to display  ED Course/ Medical Decision Making/ A&P                                 Medical Decision Making Amount and/or Complexity of Data Reviewed Labs: ordered. Radiology: ordered.   This patient presents to the ED for concern of palpitations, this involves an extensive number of treatment options, and is a complaint that carries with it a high risk of complications and morbidity.  The differential diagnosis includes arrhythmia, PACs, PVCs, anxiety, dehydration, anemia, metabolic derangements   Co morbidities that complicate the patient evaluation  Anxiety, depression, alcohol abuse   Additional history obtained:  Additional history obtained from N/A External records from outside source obtained and reviewed including EMR   Lab Tests:  I Ordered, and personally interpreted labs.  The pertinent results include: Normal kidney function, normal electrolytes, normal hemoglobin, no leukocytosis, normal troponin   Imaging Studies ordered:  I ordered imaging studies including chest x-ray I independently visualized and interpreted imaging which showed no acute findings I agree with the radiologist interpretation   Cardiac Monitoring: / EKG:  The patient was maintained on a cardiac monitor.  I personally viewed and interpreted the cardiac monitored which showed an  underlying rhythm of: Sinus rhythm   Problem List / ED Course / Critical interventions / Medication management  Patient presenting for intermittent episodes of palpitations and shortness of breath.  This is in the setting of heavy alcohol use yesterday.  On arrival in the ED, vital signs are notable for slight tachycardia with moderate hypertension.  Patient is well-appearing on exam.  He is placed on bedside cardiac monitor.  Workup was initiated.  Results of chest x-ray and lab work were all unremarkable.  Patient remained in normal sinus rhythm while in the ED.  Heart rate remained normal in the range of 90.  Cardiology referral was ordered for consideration of Zio patch placement.  Patient was discharged in stable condition.  Social Determinants of Health:  Daily alcohol use        Final Clinical Impression(s) / ED Diagnoses Final diagnoses:  Palpitations    Rx / DC Orders ED Discharge Orders          Ordered    Ambulatory referral to Cardiology       Comments: If you have not heard from the Cardiology office within the next 72 hours please call [phone].   10/14/23 1906              Iva Mariner, MD 10/14/23 [phone]

## 2023-10-14 NOTE — ED Triage Notes (Signed)
 Pt arrived via POV from local UC for further evaluation of tachycardia. Pt endorses feeling flutters in his chest. Pt endorses cold sweats, feeling panicky and reports drinking heavily this past week and reports symptoms could be related to ETOH withdrawal.

## 2024-01-12 ENCOUNTER — Ambulatory Visit: Attending: Internal Medicine | Admitting: Internal Medicine

## 2024-01-12 NOTE — Progress Notes (Signed)
 Erroneous encounter - please disregard.

## 2024-01-13 ENCOUNTER — Encounter: Payer: Self-pay | Admitting: Internal Medicine

## 2024-03-30 ENCOUNTER — Encounter: Payer: Self-pay | Admitting: Cardiology

## 2024-03-30 ENCOUNTER — Ambulatory Visit: Attending: Cardiology | Admitting: Cardiology

## 2024-03-30 VITALS — BP 128/98 | HR 96 | Ht 65.0 in | Wt 168.0 lb

## 2024-03-30 DIAGNOSIS — R002 Palpitations: Secondary | ICD-10-CM | POA: Diagnosis present

## 2024-03-30 NOTE — Patient Instructions (Addendum)
 Medication Instructions:   Continue all current medications.   Labwork:  none  Testing/Procedures:  Your physician has recommended that you wear a 7 day event monitor. Event monitors are medical devices that record the heart's electrical activity. Doctors most often us  these monitors to diagnose arrhythmias. Arrhythmias are problems with the speed or rhythm of the heartbeat. The monitor is a small, portable device. You can wear one while you do your normal daily activities. This is usually used to diagnose what is causing palpitations/syncope (passing out).  Office will contact with results via phone, letter or mychart.     Follow-Up:  2 months   Any Other Special Instructions Will Be Listed Below (If Applicable).   If you need a refill on your cardiac medications before your next appointment, please call your pharmacy.

## 2024-03-30 NOTE — Progress Notes (Signed)
      Clinical Summary Kevin Lam is a 38 y.o.male seen as a new consult, referred by PA Jolee for the following medical problems.     1.Palpitations - symptoms started about 8 months ago 10/2023 ER visit with palpitations - ER notes mention history of daily EtoH use, 3 shots of liquor and 8-10 beers - K 3.8, Mg 2.2. Trop neg. EKG sinus tach 103 - history of anxiety as well  - occurs daily. Feeling of heart rate increasing, heart racing and beating irregularly. Mild SOB, mild dizziness. Can last up to 3-5 minutes in spurts. No specific triggers.  - started diltiazem by pcp about 6 months ago. Has helped but symptoms resolved - very limited caffeine - drinks 5 times week. 2 shots of liquor, 8 beers.    Past Medical History:  Diagnosis Date   Anxiety    Depression    Sleep disorder      No Known Allergies   Current Outpatient Medications  Medication Sig Dispense Refill   ibuprofen  (ADVIL ) 200 MG tablet Take 600 mg by mouth every 6 (six) hours as needed for moderate pain.     ibuprofen  (ADVIL ,MOTRIN ) 800 MG tablet Take 1 tablet (800 mg total) by mouth 3 (three) times daily. 21 tablet 0   oxyCODONE -acetaminophen  (PERCOCET/ROXICET) 5-325 MG per tablet Take 1 tablet by mouth every 4 (four) hours as needed for severe pain. 20 tablet 0   No current facility-administered medications for this visit.     Past Surgical History:  Procedure Laterality Date   UPPER GASTROINTESTINAL ENDOSCOPY       No Known Allergies    No family history on file.   Social History Kevin Lam reports that he has never smoked. He does not have any smokeless tobacco history on file. Kevin Lam reports current alcohol use.    Physical Examination Today's Vitals   03/30/24 1119  BP: (!) 128/98  Pulse: 96  SpO2: 98%  Weight: 168 lb (76.2 kg)  Height: 5' 5 (1.651 m)   Body mass index is 27.96 kg/m.  Gen: resting comfortably, no acute distress HEENT: no scleral icterus, pupils  equal round and reactive, no palptable cervical adenopathy,  CV: RRR, no m/rg, no jvd Resp: Clear to auscultation bilaterally GI: abdomen is soft, non-tender, non-distended, normal bowel sounds, no hepatosplenomegaly MSK: extremities are warm, no edema.  Skin: warm, no rash Neuro:  no focal deficits Psych: appropriate affect     Assessment and Plan  1.Palpitations - will plan for 7 day monitor to further evaluate - can continue diltiazem pcp had started, work on cutting back on [facility]      Dorn PHEBE Ross, M.D.,

## 2024-04-01 ENCOUNTER — Other Ambulatory Visit: Payer: Self-pay | Admitting: Cardiology

## 2024-04-01 ENCOUNTER — Ambulatory Visit: Attending: Cardiology

## 2024-04-01 DIAGNOSIS — R002 Palpitations: Secondary | ICD-10-CM

## 2024-04-12 ENCOUNTER — Ambulatory Visit: Admitting: Cardiology

## 2024-06-01 ENCOUNTER — Ambulatory Visit: Admitting: Cardiology

## 2024-06-02 ENCOUNTER — Ambulatory Visit: Attending: Nurse Practitioner | Admitting: Nurse Practitioner

## 2024-06-02 ENCOUNTER — Encounter: Payer: Self-pay | Admitting: Nurse Practitioner

## 2024-06-02 VITALS — BP 124/84 | HR 102 | Ht 65.0 in | Wt 175.0 lb

## 2024-06-02 DIAGNOSIS — F419 Anxiety disorder, unspecified: Secondary | ICD-10-CM | POA: Insufficient documentation

## 2024-06-02 DIAGNOSIS — R000 Tachycardia, unspecified: Secondary | ICD-10-CM | POA: Diagnosis present

## 2024-06-02 DIAGNOSIS — F1010 Alcohol abuse, uncomplicated: Secondary | ICD-10-CM | POA: Diagnosis present

## 2024-06-02 DIAGNOSIS — R002 Palpitations: Secondary | ICD-10-CM | POA: Diagnosis present

## 2024-06-02 NOTE — Patient Instructions (Signed)
 Medication Instructions:   Your physician recommends that you continue on your current medications as directed. Please refer to the Current Medication list given to you today.   Labwork: None today  Testing/Procedures: None today  Follow-Up: 6 months  Any Other Special Instructions Will Be Listed Below (If Applicable).    Speak with your primary doctor about SSRI medications or Buspar  If you need a refill on your cardiac medications before your next appointment, please call your pharmacy.    Thank you for choosing [facility] !

## 2024-06-02 NOTE — Progress Notes (Signed)
 " Cardiology Office Note   Date:  06/02/2024 ID:  Kevin Lam, DOB 15-Sep-1985, MRN [id number] PCP: [facility] Providers Cardiologist:  Alvan Carrier, MD     History of Present Illness Kevin Lam is a very pleasant 38 y.o. male with a PMH of palpitations, elevated blood pressure, anxiety, depression, alcohol abuse, and sleep disorder, who presents today for 30-month follow-up appointment.  Last seen by Dr. Alvan on March 30, 2024.  Patient had noted palpitations starting 8 months prior, was occurring daily, had previous ER visit in May 2025 due to palpitations.  Noted having palpitations for 3 to 5 minutes in spurts, patient noted limited caffeine intake, did note drinking alcohol 5 times per week, 2 shots of liquor and 8 beers.  7-day ZIO monitor was arranged, was encouraged to cut back on EtOH use.  Preliminary 7-day ZIO monitor showed average heart rate 93 bpm, 1 run of V. tach lasting 7 beats, 1 run of SVT lasting 8.5 seconds, appear to be symptomatic with SVT episode.  Here for 62-month follow-up.  He states he is doing better since last office visit. HR does not get as elevated as it did prior.  Does admit to stress and anxiety and contributes his symptoms to this and also admits to episodes of what sounds like panic attacks. Has been weaning off alcohol some. Denies any chest pain, shortness of breath, syncope, presyncope, dizziness, orthopnea, PND, swelling or significant weight changes, acute bleeding, or claudication.   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    ROS: Negative. See HPI.   Studies Reviewed EKG: EKG is not ordered today.   Cardiac monitor 04/2024:  Patch Wear Time:  6 days and 20 hours (2025-10-27T16:24:50-0400 to 2025-11-03T12:42:31-0500)   Patient had a min HR of 57 bpm, max HR of 190 bpm, and avg HR of 93 bpm. Predominant underlying rhythm was Sinus Rhythm. 1 run of Ventricular Tachycardia occurred lasting 7  beats with a max rate of 182 bpm (avg 174 bpm). 1 run of Supraventricular  Tachycardia occurred lasting 8.5 secs with a max rate of 190 bpm (avg 156 bpm). Supraventricular Tachycardia was detected within +/- 45 seconds of symptomatic patient event(s). Isolated SVEs were rare (<1.0%), SVE Couplets were rare (<1.0%), and no SVE  Triplets were present. Isolated VEs were rare (<1.0%), and no VE Couplets or VE Triplets were present. Ventricular Bigeminy was present.       Physical Exam VS:  BP 124/84 (BP Location: Right Arm, Cuff Size: Large)   Pulse (!) 102   Ht 5' 5 (1.651 m)   Wt 175 lb (79.4 kg)   SpO2 96%   BMI 29.12 kg/m        Wt Readings from Last 3 Encounters:  06/02/24 175 lb (79.4 kg)  03/30/24 168 lb (76.2 kg)  10/14/23 169 lb 15.6 oz (77.1 kg)    GEN: Well nourished, well developed in no acute distress NECK: No JVD; No carotid bruits CARDIAC: S1/S2, fast rate and regular rhythm, no murmurs, rubs, gallops RESPIRATORY:  Clear to auscultation without rales, wheezing or rhonchi  ABDOMEN: Soft, non-tender, non-distended EXTREMITIES:  No edema; No deformity   ASSESSMENT AND PLAN  Palpitations, tachycardia Palpitations have improved on current medication regimen. Believe anxiety is contributing to his symptoms - see below. Continue diltiazem and will work on lifestyle changes. No clear indication for medication adjustments at this time. Care and ED precautions discussed.   2. Anxiety Admits  to issues and what sounds like most likely panic attacks. No improvement on current medication regimen per his report. Recommended Buspar/consideration for SSRI's and to discuss with PCP. Continue to follow with PCP.  3. Alcohol abuse Discussed weaning off alcohol. Continue to follow with PCP. Care and ED precautions discussed.    Dispo: Follow-up with MD/APP in 6 months or sooner if anything changes.   Signed, Almarie Crate, NP   "

## 2024-06-30 DIAGNOSIS — R002 Palpitations: Secondary | ICD-10-CM

## 2024-07-12 ENCOUNTER — Other Ambulatory Visit (HOSPITAL_BASED_OUTPATIENT_CLINIC_OR_DEPARTMENT_OTHER): Payer: Self-pay

## 2024-07-12 MED ORDER — DEXAMETHASONE 4 MG PO TABS
8.0000 mg | ORAL_TABLET | Freq: Every day | ORAL | 0 refills | Status: DC
  Filled 2024-07-12: qty 5, 2d supply, fill #0

## 2024-07-12 MED ORDER — DEXAMETHASONE 4 MG PO TABS: 8.0000 mg | ORAL_TABLET | Freq: Every day | ORAL | 0 refills | Status: AC

## 2024-11-26 ENCOUNTER — Ambulatory Visit: Admitting: Nurse Practitioner
